# Patient Record
Sex: Male | Born: 1965
Health system: Southern US, Community
[De-identification: ages and names within clinical notes are randomized; demographics above are authoritative.]

## PROBLEM LIST (undated history)

## (undated) DIAGNOSIS — T7840XA Allergy, unspecified, initial encounter: Secondary | ICD-10-CM

## (undated) HISTORY — PX: HERNIA REPAIR: SHX51

## (undated) HISTORY — PX: POLYPECTOMY: SHX149

## (undated) HISTORY — PX: COLONOSCOPY: SHX174

## (undated) HISTORY — PX: VASECTOMY: SHX75

## (undated) HISTORY — DX: Allergy, unspecified, initial encounter: T78.40XA

---

## 2010-01-14 ENCOUNTER — Ambulatory Visit (HOSPITAL_COMMUNITY): Admission: RE | Admit: 2010-01-14 | Discharge: 2010-01-14 | Payer: Self-pay | Admitting: Surgery

## 2014-12-23 ENCOUNTER — Encounter (HOSPITAL_COMMUNITY): Payer: Self-pay

## 2014-12-23 ENCOUNTER — Emergency Department (INDEPENDENT_AMBULATORY_CARE_PROVIDER_SITE_OTHER)
Admission: EM | Admit: 2014-12-23 | Discharge: 2014-12-23 | Disposition: A | Discharge status: Home / Self Care | Payer: Managed Care, Other (non HMO) | Source: Home / Self Care | Attending: Family Medicine | Admitting: Family Medicine

## 2014-12-23 DIAGNOSIS — J302 Other seasonal allergic rhinitis: Secondary | ICD-10-CM | POA: Diagnosis not present

## 2014-12-23 MED ORDER — FLUTICASONE PROPIONATE 50 MCG/ACT NA SUSP: 1.0000 | Freq: Two times a day (BID) | NASAL | Status: DC

## 2014-12-23 MED ORDER — TRIAMCINOLONE ACETONIDE 40 MG/ML IJ SUSP
INTRAMUSCULAR | Status: AC
  Filled 2014-12-23: qty 1

## 2014-12-23 MED ORDER — TRIAMCINOLONE ACETONIDE 40 MG/ML IJ SUSP
40.0000 mg | Freq: Once | INTRAMUSCULAR | Status: AC
  Administered 2014-12-23: 40 mg via INTRAMUSCULAR

## 2014-12-23 NOTE — ED Provider Notes (Signed)
CSN: [id number]     Arrival date & time 12/23/14  6314 History   First MD Initiated Contact with Patient 12/23/14 [phone]     Chief Complaint  Patient presents with  . Cough   (Consider location/radiation/quality/duration/timing/severity/associated sxs/prior Treatment) Patient is a 49 y.o. male presenting with cough. The history is provided by the patient.  Cough Cough characteristics:  Non-productive, dry and harsh Severity:  Moderate Onset quality:  Gradual Duration:  4 days Progression:  Unchanged Chronicity:  New Smoker: no   Context: weather changes   Ineffective treatments:  Cough suppressants Associated symptoms: eye discharge, rhinorrhea and sinus congestion   Associated symptoms: no chills, no fever, no rash and no wheezing     History reviewed. No pertinent past medical history. History reviewed. No pertinent past surgical history. No family history on file. History  Substance Use Topics  . Smoking status: Not on file  . Smokeless tobacco: Not on file  . Alcohol Use: Not on file    Review of Systems  Constitutional: Negative for fever and chills.  HENT: Positive for congestion, postnasal drip and rhinorrhea.   Eyes: Positive for discharge.  Respiratory: Positive for cough. Negative for wheezing.   Cardiovascular: Negative.   Gastrointestinal: Negative.   Skin: Negative for rash.    Allergies  Review of patient's allergies indicates no known allergies.  Home Medications   Prior to Admission medications   Medication Sig Start Date End Date Taking? Authorizing Provider  fluticasone (FLONASE) 50 MCG/ACT nasal spray Place 1 spray into both nostrils 2 (two) times daily. 12/23/14   Billy Fischer, MD   BP 135/68 mmHg  Pulse 73  Temp(Src) 98.3 F (36.8 C) (Oral)  Resp 18  SpO2 98% Physical Exam  Constitutional: He appears well-developed and well-nourished.  HENT:  Right Ear: External ear normal.  Left Ear: External ear normal.  Mouth/Throat: Oropharynx is  clear and moist.  Eyes: Conjunctivae are normal. Pupils are equal, round, and reactive to light.  Neck: Normal range of motion. Neck supple.  Cardiovascular: Normal heart sounds and intact distal pulses.   Pulmonary/Chest: Effort normal and breath sounds normal.  Lymphadenopathy:    He has no cervical adenopathy.  Skin: Skin is warm and dry.  Nursing note and vitals reviewed.   ED Course  Procedures (including critical care time) Labs Review Labs Reviewed - No data to display  Imaging Review No results found.   MDM   1. Seasonal allergic rhinitis        Billy Fischer, MD 12/23/14 [phone]

## 2014-12-23 NOTE — ED Notes (Signed)
Patient complains of cough, congestion "goop" in his eyes Symptoms started about four days ago OTC medications not working Does take medication for allergies but not clearing him up

## 2015-06-13 ENCOUNTER — Emergency Department (HOSPITAL_COMMUNITY): Payer: Managed Care, Other (non HMO)

## 2015-06-13 ENCOUNTER — Emergency Department (HOSPITAL_COMMUNITY)
Admission: EM | Admit: 2015-06-13 | Discharge: 2015-06-13 | Disposition: A | Discharge status: Home / Self Care | Payer: Managed Care, Other (non HMO) | Attending: Emergency Medicine | Admitting: Emergency Medicine

## 2015-06-13 ENCOUNTER — Encounter (HOSPITAL_COMMUNITY): Payer: Self-pay | Admitting: Emergency Medicine

## 2015-06-13 DIAGNOSIS — Y929 Unspecified place or not applicable: Secondary | ICD-10-CM | POA: Diagnosis not present

## 2015-06-13 DIAGNOSIS — S99922A Unspecified injury of left foot, initial encounter: Secondary | ICD-10-CM | POA: Diagnosis present

## 2015-06-13 DIAGNOSIS — S92502A Displaced unspecified fracture of left lesser toe(s), initial encounter for closed fracture: Secondary | ICD-10-CM

## 2015-06-13 DIAGNOSIS — Y999 Unspecified external cause status: Secondary | ICD-10-CM | POA: Insufficient documentation

## 2015-06-13 DIAGNOSIS — S92512A Displaced fracture of proximal phalanx of left lesser toe(s), initial encounter for closed fracture: Secondary | ICD-10-CM | POA: Diagnosis not present

## 2015-06-13 DIAGNOSIS — Z7951 Long term (current) use of inhaled steroids: Secondary | ICD-10-CM | POA: Diagnosis not present

## 2015-06-13 DIAGNOSIS — Y9389 Activity, other specified: Secondary | ICD-10-CM | POA: Insufficient documentation

## 2015-06-13 DIAGNOSIS — W2209XA Striking against other stationary object, initial encounter: Secondary | ICD-10-CM | POA: Insufficient documentation

## 2015-06-13 MED ORDER — TRAMADOL HCL 50 MG PO TABS: 50.0000 mg | ORAL_TABLET | Freq: Four times a day (QID) | ORAL | Status: DC | PRN

## 2015-06-13 NOTE — ED Notes (Signed)
The patient was able to walk in to the emergency department but does say it hurts to put weight on the foot.

## 2015-06-13 NOTE — Discharge Instructions (Signed)
As discussed, it is important that you follow-up with our orthopedist in one week.  Return here for concerning changes in your condition.

## 2015-06-13 NOTE — ED Notes (Signed)
The patient said he ran into some furniture and hit his left foot.  He does have swelling, and redness to the left foot.  He rates his pain 8/10.

## 2015-06-13 NOTE — ED Provider Notes (Signed)
CSN: [id number]     Arrival date & time 06/13/15  0458 History   First MD Initiated Contact with Patient 06/13/15 0459     Chief Complaint  Patient presents with  . Foot Injury    The patient said he ran into some furniture and hit his left foot.  He does have swelling, and redness to the left foot.  He rates his pain 8/10.     (Consider location/radiation/quality/duration/timing/severity/associated sxs/prior Treatment) HPI Patient presents with pain in the left lateral distal foot after minor trauma. Patient ran his foot into a piece of furniture accident. Since that time he said throbbing soreness in the left foot. No loss of sensation or function. No other injuries sustained. Minimal relief with rest. It is worse with weightbearing.  History reviewed. No pertinent past medical history. Past Surgical History  Procedure Laterality Date  . Hernia repair     History reviewed. No pertinent family history. Social History  Substance Use Topics  . Smoking status: Never Smoker   . Smokeless tobacco: Never Used  . Alcohol Use: Yes     Comment: socially    Review of Systems  Constitutional: Negative for fever.  Respiratory: Negative for shortness of breath.   Cardiovascular: Negative for chest pain.  Musculoskeletal:       Negative aside from HPI  Skin:       Negative aside from HPI  Allergic/Immunologic: Negative for immunocompromised state.  Neurological: Negative for weakness.      Allergies  Review of patient's allergies indicates no known allergies.  Home Medications   Prior to Admission medications   Medication Sig Start Date End Date Taking? Authorizing Provider  fluticasone (FLONASE) 50 MCG/ACT nasal spray Place 1 spray into both nostrils 2 (two) times daily. 12/23/14   Billy Fischer, MD   There were no vitals taken for this visit. Physical Exam  Constitutional: He is oriented to person, place, and time. He appears well-developed. No distress.  HENT:  Head:  Normocephalic and atraumatic.  Eyes: Conjunctivae and EOM are normal.  Cardiovascular: Normal rate, regular rhythm and intact distal pulses.   Pulmonary/Chest: Effort normal. No stridor. No respiratory distress.  Musculoskeletal: He exhibits no edema.       Left ankle: Normal.       Feet:  Neurological: He is alert and oriented to person, place, and time.  Skin: Skin is warm and dry.  Psychiatric: He has a normal mood and affect.  Nursing note and vitals reviewed.   ED Course  Procedures (including critical care time)  After the initial evaluation the patient had ice pack applied to his injury.  I reviewed the results (including imaging as performed), agree with the interpretation  On repeat exam the patient appears better.  We reviewed all findings, and I showed him his XRAY.   Patient placed in post-op shoe   MDM   Patient presents after sustaining trauma to his left distal foot. X-rays demonstrate fracture of the fifth toe, no other notable injuries. Patient tolerated immobilization with postoperative shoe well, and after conversation about home care, he was discharged in stable condition to follow-up with orthopedics.  Carmin Muskrat, MD 06/13/15 [phone]

## 2015-06-13 NOTE — ED Notes (Signed)
Patient is alert and orientedx4.  Patient was explained discharge instructions and they understood them with no questions.   

## 2015-11-04 ENCOUNTER — Encounter: Payer: Self-pay | Admitting: Gastroenterology

## 2015-12-30 ENCOUNTER — Ambulatory Visit (AMBULATORY_SURGERY_CENTER): Payer: Self-pay | Admitting: *Deleted

## 2015-12-30 VITALS — Ht 69.0 in | Wt 254.0 lb

## 2015-12-30 DIAGNOSIS — Z1211 Encounter for screening for malignant neoplasm of colon: Secondary | ICD-10-CM

## 2015-12-30 MED ORDER — NA SULFATE-K SULFATE-MG SULF 17.5-3.13-1.6 GM/177ML PO SOLN: 1.0000 | Freq: Once | ORAL | Status: DC

## 2015-12-30 NOTE — Progress Notes (Signed)
No egg or soy allergy known to patient  No issues with past sedation with any surgeries  or procedures, no intubation problems  No diet pills per patient No home 02 use per patient  No blood thinners per patient  Pt denies issues with constipation  emmi declined

## 2016-01-17 ENCOUNTER — Encounter: Payer: Managed Care, Other (non HMO) | Admitting: Gastroenterology

## 2016-03-17 ENCOUNTER — Telehealth: Payer: Self-pay | Admitting: Gastroenterology

## 2016-03-17 DIAGNOSIS — Z1211 Encounter for screening for malignant neoplasm of colon: Secondary | ICD-10-CM

## 2016-03-17 MED ORDER — NA SULFATE-K SULFATE-MG SULF 17.5-3.13-1.6 GM/177ML PO SOLN: 1.0000 | Freq: Once | ORAL | Status: DC

## 2016-03-17 MED FILL — SUPREP BOWEL PREP KIT: 17.5-3.13-1 | 1 days supply | Qty: 354 | Fill #0

## 2016-03-17 NOTE — Telephone Encounter (Signed)
Resent prep to [facility]- attempted to pt notifiy pt called number left with no answer- marie PV

## 2016-03-20 ENCOUNTER — Ambulatory Visit (AMBULATORY_SURGERY_CENTER): Payer: Managed Care, Other (non HMO) | Admitting: Gastroenterology

## 2016-03-20 ENCOUNTER — Encounter: Payer: Self-pay | Admitting: Gastroenterology

## 2016-03-20 VITALS — BP 127/86 | HR 65 | Temp 98.6°F | Resp 23 | Ht 69.0 in | Wt 254.0 lb

## 2016-03-20 DIAGNOSIS — Z1211 Encounter for screening for malignant neoplasm of colon: Secondary | ICD-10-CM

## 2016-03-20 DIAGNOSIS — D123 Benign neoplasm of transverse colon: Secondary | ICD-10-CM

## 2016-03-20 DIAGNOSIS — D122 Benign neoplasm of ascending colon: Secondary | ICD-10-CM | POA: Diagnosis not present

## 2016-03-20 DIAGNOSIS — K635 Polyp of colon: Secondary | ICD-10-CM | POA: Diagnosis not present

## 2016-03-20 MED ORDER — SODIUM CHLORIDE 0.9 % IV SOLN: 500.0000 mL | INTRAVENOUS | Status: DC

## 2016-03-20 NOTE — Patient Instructions (Signed)
YOU HAD AN ENDOSCOPIC PROCEDURE TODAY AT [facility]:   Refer to the procedure report that was given to you for any specific questions about what was found during the examination.  If the procedure report does not answer your questions, please call your gastroenterologist to clarify.  If you requested that your care partner not be given the details of your procedure findings, then the procedure report has been included in a sealed envelope for you to review at your convenience later.  YOU SHOULD EXPECT: Some feelings of bloating in the abdomen. Passage of more gas than usual.  Walking can help get rid of the air that was put into your GI tract during the procedure and reduce the bloating. If you had a lower endoscopy (such as a colonoscopy or flexible sigmoidoscopy) you may notice spotting of blood in your stool or on the toilet paper. If you underwent a bowel prep for your procedure, you may not have a normal bowel movement for a few days.  Please Note:  You might notice some irritation and congestion in your nose or some drainage.  This is from the oxygen used during your procedure.  There is no need for concern and it should clear up in a day or so.  SYMPTOMS TO REPORT IMMEDIATELY:   Following lower endoscopy (colonoscopy or flexible sigmoidoscopy):  Excessive amounts of blood in the stool  Significant tenderness or worsening of abdominal pains  Swelling of the abdomen that is new, acute  Fever of 100F or higher    For urgent or emergent issues, a gastroenterologist can be reached at any hour by calling [phone].   DIET: Your first meal following the procedure should be a small meal and then it is ok to progress to your normal diet. Heavy or fried foods are harder to digest and may make you feel nauseous or bloated.  Likewise, meals heavy in dairy and vegetables can increase bloating.  Drink plenty of fluids but you should avoid alcoholic beverages for 24  hours.  ACTIVITY:  You should plan to take it easy for the rest of today and you should NOT DRIVE or use heavy machinery until tomorrow (because of the sedation medicines used during the test).    FOLLOW UP: Our staff will call the number listed on your records the next business day following your procedure to check on you and address any questions or concerns that you may have regarding the information given to you following your procedure. If we do not reach you, we will leave a message.  However, if you are feeling well and you are not experiencing any problems, there is no need to return our call.  We will assume that you have returned to your regular daily activities without incident.  If any biopsies were taken you will be contacted by phone or by letter within the next 1-3 weeks.  Please call us at [phone] if you have not heard about the biopsies in 3 weeks.    SIGNATURES/CONFIDENTIALITY: You and/or your care partner have signed paperwork which will be entered into your electronic medical record.  These signatures attest to the fact that that the information above on your After Visit Summary has been reviewed and is understood.  Full responsibility of the confidentiality of this discharge information lies with you and/or your care-partner.   No Aspirin or anti inflammatory products for 2 weeks.  Information on polyps given to you today  Information on hemorrhoid banding  given to you today

## 2016-03-20 NOTE — Op Note (Signed)
[name] Procedure Date: 03/20/2016 1:09 PM MRN: [id number] Endoscopist: Remo Lipps P. Havery Moros , MD Age: 50 Referring MD:  Date of Birth: 09/10/1966 Gender: Male Account #: [id number] Procedure:                Colonoscopy Indications:              Screening for malignant neoplasm in the colon, This                            is the patient's first colonoscopy Medicines:                Monitored Anesthesia Care Procedure:                Pre-Anesthesia Assessment:                           - Prior to the procedure, a History and Physical                            was performed, and patient medications and                            allergies were reviewed. The patient's tolerance of                            previous anesthesia was also reviewed. The risks                            and benefits of the procedure and the sedation                            options and risks were discussed with the patient.                            All questions were answered, and informed consent                            was obtained. Prior Anticoagulants: The patient has                            taken no previous anticoagulant or antiplatelet                            agents. ASA Grade Assessment: II - A patient with                            mild systemic disease. After reviewing the risks                            and benefits, the patient was deemed in                            satisfactory condition to undergo the procedure.  After obtaining informed consent, the colonoscope                            was passed under direct vision. Throughout the                            procedure, the patient's blood pressure, pulse, and                            oxygen saturations were monitored continuously. The                            Model CF-HQ190L [id number]) scope was introduced                            through the  anus and advanced to the the terminal                            ileum, with identification of the appendiceal                            orifice and IC valve. The colonoscopy was performed                            without difficulty. The patient tolerated the                            procedure well. The quality of the bowel                            preparation was good. The terminal ileum, ileocecal                            valve, appendiceal orifice, and rectum were                            photographed. Scope In: 1:32:53 PM Scope Out: 1:51:33 PM Scope Withdrawal Time: 0 hours 16 minutes 46 seconds  Total Procedure Duration: 0 hours 18 minutes 40 seconds  Findings:                 The perianal and digital rectal examinations were                            normal.                           A 15 mm polyp was found in the ascending colon. The                            polyp was semi-pedunculated. The polyp was removed                            with a hot snare. Resection and retrieval were  complete.                           Two sessile polyps were found in the transverse                            colon. The polyps were 4 mm in size. These polyps                            were removed with a cold snare. Resection and                            retrieval were complete.                           A 4 mm polyp was found in the splenic flexure. The                            polyp was sessile. The polyp was removed with a                            cold snare. Resection and retrieval were complete.                           Non-bleeding internal hemorrhoids were found during                            retroflexion.                           The terminal ileum appeared normal.                           The exam was otherwise normal throughout the                            examined colon. Complications:            No immediate complications. Estimated  blood loss:                            Minimal. Estimated Blood Loss:     Estimated blood loss was minimal. Impression:               - One 15 mm polyp in the ascending colon, removed                            with a hot snare. Resected and retrieved.                           - Two 4 mm polyps in the transverse colon, removed                            with a cold snare. Resected and retrieved.                           -  One 4 mm polyp at the splenic flexure, removed                            with a cold snare. Resected and retrieved.                           - Non-bleeding internal hemorrhoids.                           - The examined portion of the ileum was normal. Recommendation:           - Patient has a contact number available for                            emergencies. The signs and symptoms of potential                            delayed complications were discussed with the                            patient. Return to normal activities tomorrow.                            Written discharge instructions were provided to the                            patient.                           - Resume previous diet.                           - Continue present medications.                           - No aspirin, ibuprofen, naproxen, or other                            non-steroidal anti-inflammatory drugs for 2 weeks                            after polyp removal.                           - Await pathology results.                           - Repeat colonoscopy is recommended for                            surveillance. The colonoscopy date will be                            determined after pathology results from today's  exam become available for review. Remo Lipps P. Krishay Faro, MD 03/20/2016 1:56:55 PM This report has been signed electronically.

## 2016-03-20 NOTE — Progress Notes (Signed)
A and O x3. Report to RN. Tolerated MAC anesthesia well. 

## 2016-03-20 NOTE — Progress Notes (Signed)
Called to room to assist during endoscopic procedure.  Patient ID and intended procedure confirmed with present staff. Received instructions for my participation in the procedure from the performing physician.  

## 2016-03-23 ENCOUNTER — Telehealth: Payer: Self-pay | Admitting: *Deleted

## 2016-03-23 NOTE — Telephone Encounter (Signed)
  Follow up Call-  Call back number 03/20/2016  Post procedure Call Back phone  # [phone]  Permission to leave phone message Yes     Patient questions:  Do you have a fever, pain , or abdominal swelling? No. Pain Score  0 *  Have you tolerated food without any problems? Yes.    Have you been able to return to your normal activities? Yes.    Do you have any questions about your discharge instructions: Diet   No. Medications  No. Follow up visit  No.  Do you have questions or concerns about your Care? No.  Actions: * If pain score is 4 or above: No action needed, pain <4.

## 2016-03-26 ENCOUNTER — Encounter: Payer: Self-pay | Admitting: Gastroenterology

## 2017-02-09 MED FILL — CHLORHEXIDINE 0.12% RINSE: 0.12 | 16 days supply | Qty: 473 | Fill #0

## 2017-10-14 MED FILL — PENICILLIN VK 500 MG TABLET: 500 | 12 days supply | Qty: 40 | Fill #0

## 2017-10-27 MED FILL — OSELTAMIVIR PHOSPHATE 75 MG: 75 | 10 days supply | Qty: 10 | Fill #0

## 2017-11-08 ENCOUNTER — Other Ambulatory Visit: Payer: Self-pay | Admitting: Internal Medicine

## 2017-11-08 DIAGNOSIS — N5089 Other specified disorders of the male genital organs: Secondary | ICD-10-CM

## 2017-11-09 ENCOUNTER — Other Ambulatory Visit: Payer: Managed Care, Other (non HMO)

## 2017-11-15 ENCOUNTER — Ambulatory Visit
Admission: RE | Admit: 2017-11-15 | Discharge: 2017-11-15 | Disposition: A | Discharge status: Home / Self Care | Payer: 59 | Source: Ambulatory Visit | Attending: Internal Medicine | Admitting: Internal Medicine

## 2017-11-15 DIAGNOSIS — N5089 Other specified disorders of the male genital organs: Secondary | ICD-10-CM

## 2017-11-16 MED FILL — CEFDINIR 300 MG CAPS: 300 | 7 days supply | Qty: 14 | Fill #0

## 2017-11-16 MED FILL — HYDROCODONE-HOMATROPINE SOL: 5-1.5 | 9 days supply | Qty: 180 | Fill #0

## 2017-12-24 MED FILL — CHLORHEXIDINE 0.12% RINSE: 0.12 | 16 days supply | Qty: 473 | Fill #1

## 2018-11-08 MED FILL — PENICILLIN VK 500 MG TABLET: 500 | 10 days supply | Qty: 20 | Fill #0

## 2018-11-08 MED FILL — BENZONATATE 100 MG CAP: 100 | 6 days supply | Qty: 20 | Fill #0

## 2019-02-21 MED FILL — CHLORHEXIDINE 0.12% RINSE: 0.12 | 16 days supply | Qty: 473 | Fill #0

## 2019-03-09 ENCOUNTER — Encounter: Payer: Self-pay | Admitting: Gastroenterology

## 2019-04-04 MED FILL — CHLORHEXIDINE 0.12% RINSE: 0.12 | 16 days supply | Qty: 473 | Fill #0

## 2019-04-25 ENCOUNTER — Encounter: Payer: Self-pay | Admitting: Gastroenterology

## 2019-06-05 ENCOUNTER — Ambulatory Visit (AMBULATORY_SURGERY_CENTER): Payer: Self-pay | Admitting: *Deleted

## 2019-06-05 ENCOUNTER — Other Ambulatory Visit: Payer: Self-pay

## 2019-06-05 VITALS — Temp 97.5°F | Ht 69.0 in | Wt 263.0 lb

## 2019-06-05 DIAGNOSIS — Z86010 Personal history of colonic polyps: Secondary | ICD-10-CM

## 2019-06-05 MED ORDER — NA SULFATE-K SULFATE-MG SULF 17.5-3.13-1.6 GM/177ML PO SOLN: ORAL | 0 refills | Status: DC

## 2019-06-05 MED FILL — SUPREP BOWEL PREP KIT: 17.5-3.13-1 | 1 days supply | Qty: 354 | Fill #0

## 2019-06-05 NOTE — Progress Notes (Signed)
Patient is here in-person for PV. Patient denies any allergies to eggs or soy. Patient denies any problems with anesthesia/sedation. Patient denies any oxygen use at home. Patient denies taking any diet/weight loss medications or blood thinners. Patient is not being treated for MRSA or C-diff.  Suprep $15 off coupon given to pt.   Pt is aware that care partner will wait in the car during procedure; if they feel like they will be too hot to wait in the car; they may wait in the lobby.  We want them to wear a mask (we do not have any that we can provide them), practice social distancing, and we will check their temperatures when they get here.  I did remind patient that their care partner needs to stay in the parking lot the entire time. Pt will wear mask into building.

## 2019-06-07 ENCOUNTER — Encounter: Payer: Self-pay | Admitting: Gastroenterology

## 2019-06-19 ENCOUNTER — Telehealth: Payer: Self-pay

## 2019-06-19 NOTE — Telephone Encounter (Signed)
Covid-19 screening questions   Do you now or have you had a fever in the last 14 days?  Do you have any respiratory symptoms of shortness of breath or cough now or in the last 14 days?  Do you have any family members or close contacts with diagnosed or suspected Covid-19 in the past 14 days?  Have you been tested for Covid-19 and found to be positive?       

## 2019-06-20 ENCOUNTER — Encounter: Payer: Self-pay | Admitting: Gastroenterology

## 2019-06-20 ENCOUNTER — Other Ambulatory Visit: Payer: Self-pay

## 2019-06-20 ENCOUNTER — Ambulatory Visit (AMBULATORY_SURGERY_CENTER): Payer: 59 | Admitting: Gastroenterology

## 2019-06-20 VITALS — BP 120/69 | HR 62 | Temp 98.4°F | Resp 14 | Ht 69.0 in | Wt 263.0 lb

## 2019-06-20 DIAGNOSIS — K635 Polyp of colon: Secondary | ICD-10-CM

## 2019-06-20 DIAGNOSIS — D123 Benign neoplasm of transverse colon: Secondary | ICD-10-CM | POA: Diagnosis not present

## 2019-06-20 DIAGNOSIS — D120 Benign neoplasm of cecum: Secondary | ICD-10-CM

## 2019-06-20 DIAGNOSIS — Z1211 Encounter for screening for malignant neoplasm of colon: Secondary | ICD-10-CM

## 2019-06-20 DIAGNOSIS — Z86010 Personal history of colonic polyps: Secondary | ICD-10-CM

## 2019-06-20 MED ORDER — SODIUM CHLORIDE 0.9 % IV SOLN: 500.0000 mL | Freq: Once | INTRAVENOUS | Status: DC

## 2019-06-20 NOTE — Progress Notes (Signed)
Pt stated no changed in medical or surgical history

## 2019-06-20 NOTE — Op Note (Signed)
[facility] Patient Name: Michael Rios Procedure Date: 06/20/2019 1:28 PM MRN: [id number] Endoscopist: Remo Lipps P. Havery Moros , MD Age: 53 Referring MD:  Date of Birth: [birth year] Gender: Male Account #: [id number] Procedure:                Colonoscopy Indications:              Surveillance: Personal history of adenomatous                            polyps on last colonoscopy 3 years ago (advanced                            adenoma x 1 and other smaller polyps) Medicines:                Monitored Anesthesia Care Procedure:                Pre-Anesthesia Assessment:                           - Prior to the procedure, a History and Physical                            was performed, and patient medications and                            allergies were reviewed. The patient's tolerance of                            previous anesthesia was also reviewed. The risks                            and benefits of the procedure and the sedation                            options and risks were discussed with the patient.                            All questions were answered, and informed consent                            was obtained. Prior Anticoagulants: The patient has                            taken no previous anticoagulant or antiplatelet                            agents. ASA Grade Assessment: III - A patient with                            severe systemic disease. After reviewing the risks                            and benefits, the patient was deemed in  satisfactory condition to undergo the procedure.                           After obtaining informed consent, the colonoscope                            was passed under direct vision. Throughout the                            procedure, the patient's blood pressure, pulse, and                            oxygen saturations were monitored continuously. The                            Colonoscope was  introduced through the anus and                            advanced to the the cecum, identified by                            appendiceal orifice and ileocecal valve. The                            colonoscopy was performed without difficulty. The                            patient tolerated the procedure well. The quality                            of the bowel preparation was good. The ileocecal                            valve, appendiceal orifice, and rectum were                            photographed. Scope In: 1:32:16 PM Scope Out: 1:57:13 PM Scope Withdrawal Time: 0 hours 22 minutes 12 seconds  Total Procedure Duration: 0 hours 24 minutes 57 seconds  Findings:                 The perianal and digital rectal examinations were                            normal.                           A 4 mm polyp was found in the cecum. The polyp was                            flat. The polyp was removed with a cold snare.                            Resection and retrieval were complete.  A 3 mm polyp was found in the hepatic flexure. The                            polyp was sessile. The polyp was removed with a                            cold snare. Resection and retrieval were complete.                           A diminutive polyp was found in the transverse                            colon. The polyp was flat. The polyp was removed                            with a cold biopsy forceps, as the snare was not                            grasping the lesion well. Resection and retrieval                            were complete.                           The colon was tortuous.                           Internal hemorrhoids were found during retroflexion.                           The exam was otherwise without abnormality. Complications:            No immediate complications. Estimated blood loss:                            Minimal. Estimated Blood Loss:     Estimated  blood loss was minimal. Impression:               - One 4 mm polyp in the cecum, removed with a cold                            snare. Resected and retrieved.                           - One 3 mm polyp at the hepatic flexure, removed                            with a cold snare. Resected and retrieved.                           - One diminutive polyp in the transverse colon,                            removed with a cold biopsy forceps. Resected  and                            retrieved.                           - Tortuous colon.                           - Internal hemorrhoids.                           - The examination was otherwise normal. Recommendation:           - Patient has a contact number available for                            emergencies. The signs and symptoms of potential                            delayed complications were discussed with the                            patient. Return to normal activities tomorrow.                            Written discharge instructions were provided to the                            patient.                           - Resume previous diet.                           - Continue present medications.                           - Await pathology results. Remo Lipps P. Zlata Alcaide, MD 06/20/2019 2:01:13 PM This report has been signed electronically.

## 2019-06-20 NOTE — Progress Notes (Signed)
No problems noted in the recovery room. maw 

## 2019-06-20 NOTE — Progress Notes (Signed)
Called to room to assist during endoscopic procedure.  Patient ID and intended procedure confirmed with present staff. Received instructions for my participation in the procedure from the performing physician.  

## 2019-06-20 NOTE — Progress Notes (Signed)
A/ox3, pleased with MAC, report to RN 

## 2019-06-20 NOTE — Patient Instructions (Addendum)
YOU HAD AN ENDOSCOPIC PROCEDURE TODAY AT [facility]:   Refer to the procedure report that was given to you for any specific questions about what was found during the examination.  If the procedure report does not answer your questions, please call your gastroenterologist to clarify.  If you requested that your care partner not be given the details of your procedure findings, then the procedure report has been included in a sealed envelope for you to review at your convenience later.  YOU SHOULD EXPECT: Some feelings of bloating in the abdomen. Passage of more gas than usual.  Walking can help get rid of the air that was put into your GI tract during the procedure and reduce the bloating. If you had a lower endoscopy (such as a colonoscopy or flexible sigmoidoscopy) you may notice spotting of blood in your stool or on the toilet paper. If you underwent a bowel prep for your procedure, you may not have a normal bowel movement for a few days.  Please Note:  You might notice some irritation and congestion in your nose or some drainage.  This is from the oxygen used during your procedure.  There is no need for concern and it should clear up in a day or so.  SYMPTOMS TO REPORT IMMEDIATELY:   Following lower endoscopy (colonoscopy or flexible sigmoidoscopy):  Excessive amounts of blood in the stool  Significant tenderness or worsening of abdominal pains  Swelling of the abdomen that is new, acute  Fever of 100F or higher   For urgent or emergent issues, a gastroenterologist can be reached at any hour by calling [phone].   DIET:  We do recommend a small meal at first, but then you may proceed to your regular diet.  Drink plenty of fluids but you should avoid alcoholic beverages for 24 hours.  ACTIVITY:  You should plan to take it easy for the rest of today and you should NOT DRIVE or use heavy machinery until tomorrow (because of the sedation medicines used during the test).     FOLLOW UP: Our staff will call the number listed on your records 48-72 hours following your procedure to check on you and address any questions or concerns that you may have regarding the information given to you following your procedure. If we do not reach you, we will leave a message.  We will attempt to reach you two times.  During this call, we will ask if you have developed any symptoms of COVID 19. If you develop any symptoms (ie: fever, flu-like symptoms, shortness of breath, cough etc.) before then, please call [phone].  If you test positive for Covid 19 in the 2 weeks post procedure, please call and report this information to [place].    If any biopsies were taken you will be contacted by phone or by letter within the next 1-3 weeks.  Please call us at [phone] if you have not heard about the biopsies in 3 weeks.    SIGNATURES/CONFIDENTIALITY: You and/or your care partner have signed paperwork which will be entered into your electronic medical record.  These signatures attest to the fact that that the information above on your After Visit Summary has been reviewed and is understood.  Full responsibility of the confidentiality of this discharge information lies with you and/or your care-partner.   Handouts were given to you on polyps and hemorrhoids. You may resume your current medications today. Await biopsy results. Please call if any questions or  concerns.

## 2019-06-22 ENCOUNTER — Telehealth: Payer: Self-pay

## 2019-06-22 NOTE — Telephone Encounter (Signed)
No answer, left message to call back later today, B.Avanti Jetter RN. 

## 2019-06-22 NOTE — Telephone Encounter (Signed)
Attempted to reach patient for post-procedure f/u call. No answer. Left message for him to please not hesitate to call us if he has any questions/concerns regarding his care. 

## 2019-06-23 ENCOUNTER — Encounter: Payer: Self-pay | Admitting: Gastroenterology

## 2019-08-09 IMAGING — US US SCROTUM W/ DOPPLER COMPLETE
1 series · 13 of 25 positions shown · non-contrast
Comparison: None in PACs

CLINICAL DATA: Left scrotal mass

EXAM:
SCROTAL ULTRASOUND
DOPPLER ULTRASOUND OF THE TESTICLES
TECHNIQUE: Complete ultrasound examination of the testicles, epididymis, and
other scrotal structures was performed. Color and spectral Doppler
ultrasound were also utilized to evaluate blood flow to the
testicles.

[Series 1: us scrotum w/ doppler complete · 0.06mm/px · 13 of 65 slices shown]
[im 1/65]
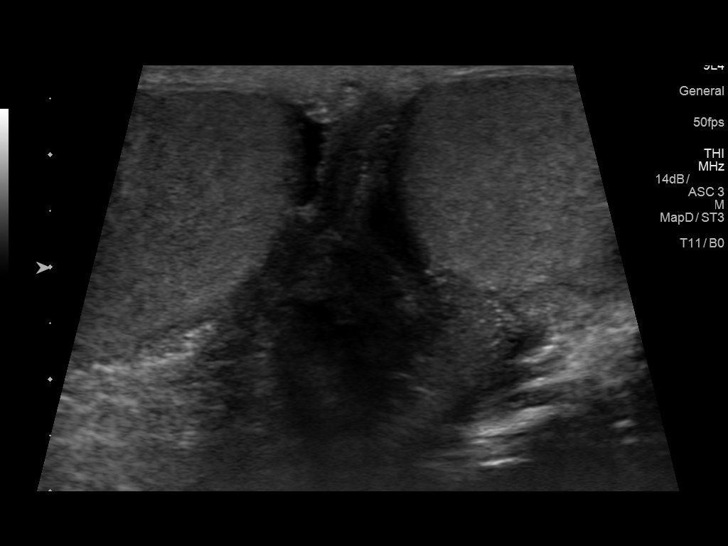
[im 6/65]
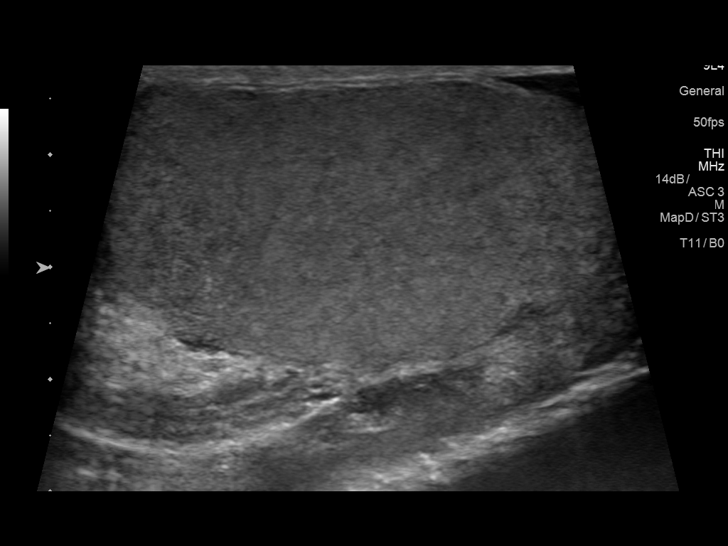
[im 11/65]
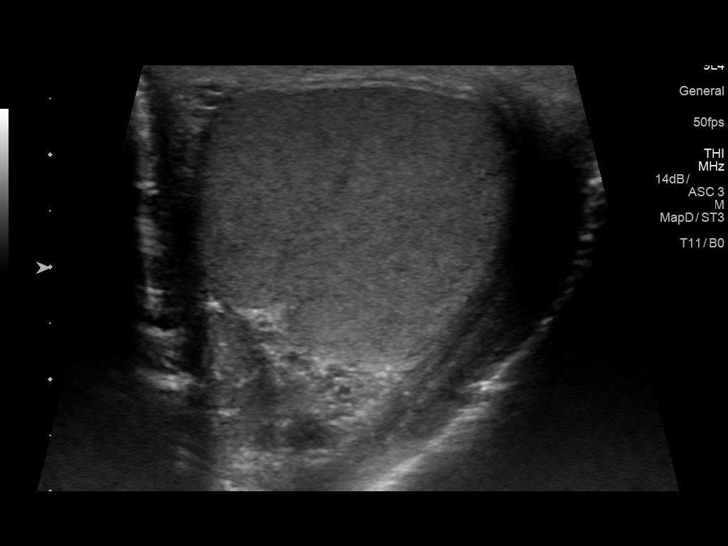
[im 17/65]
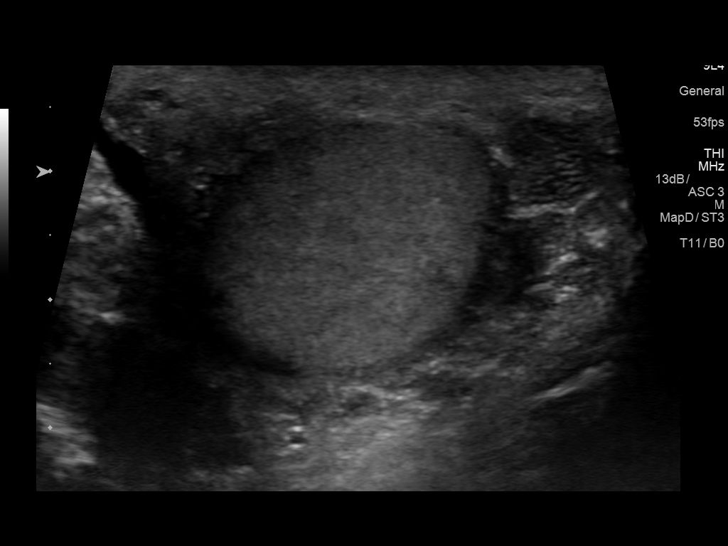
[im 22/65]
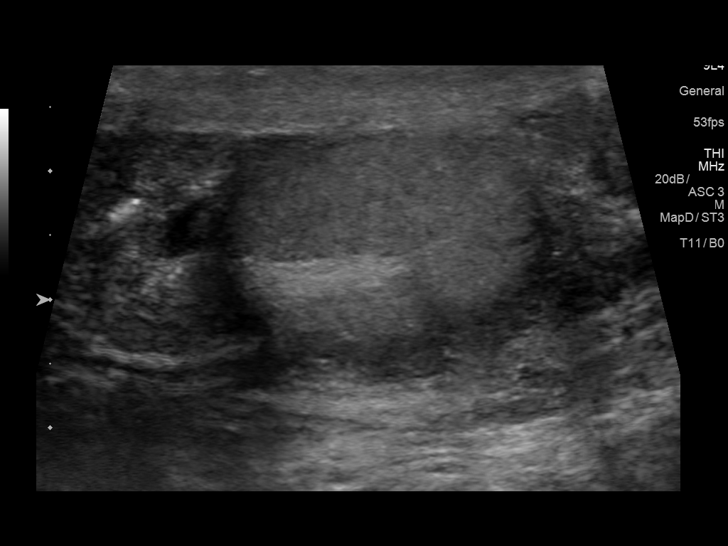
[im 27/65]
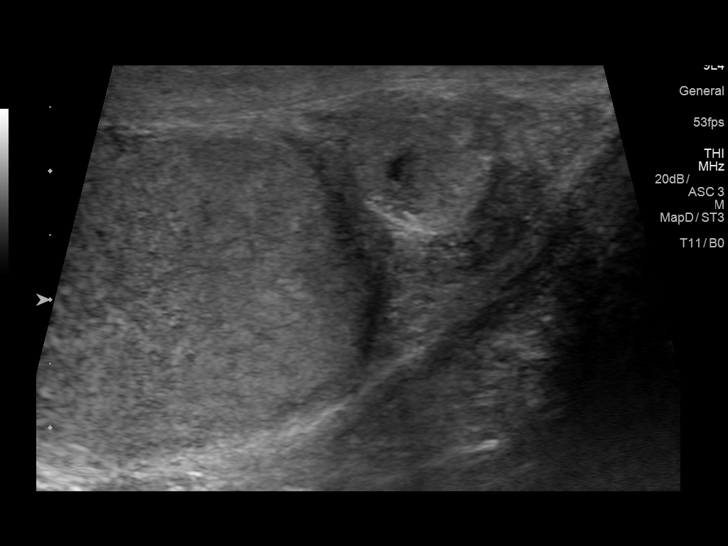
[im 33/65]
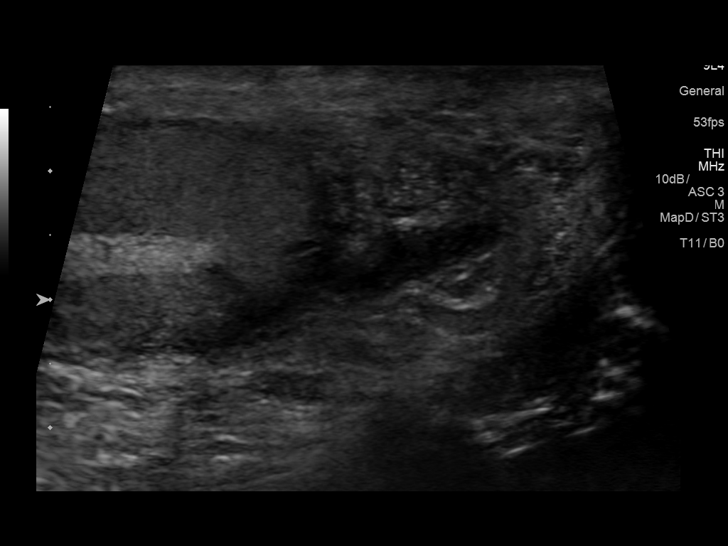
[im 38/65]
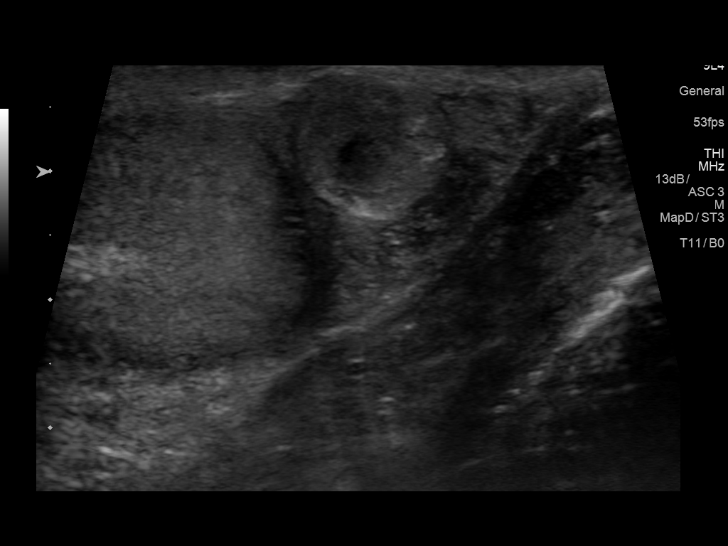
[im 43/65]
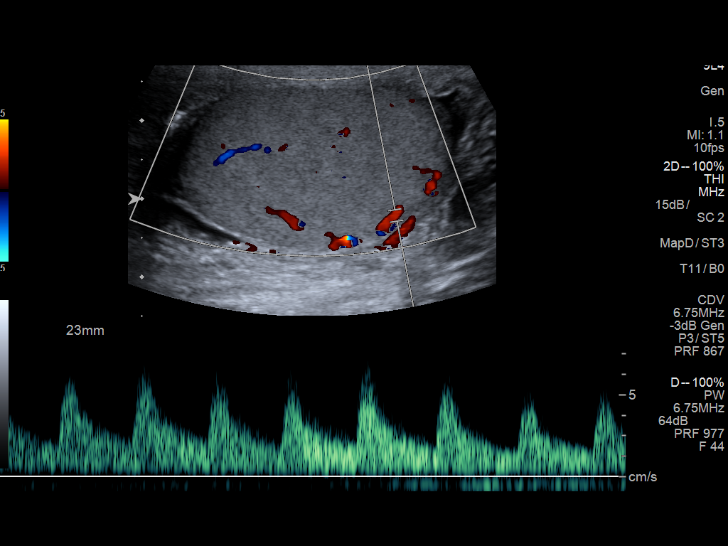
[im 49/65]
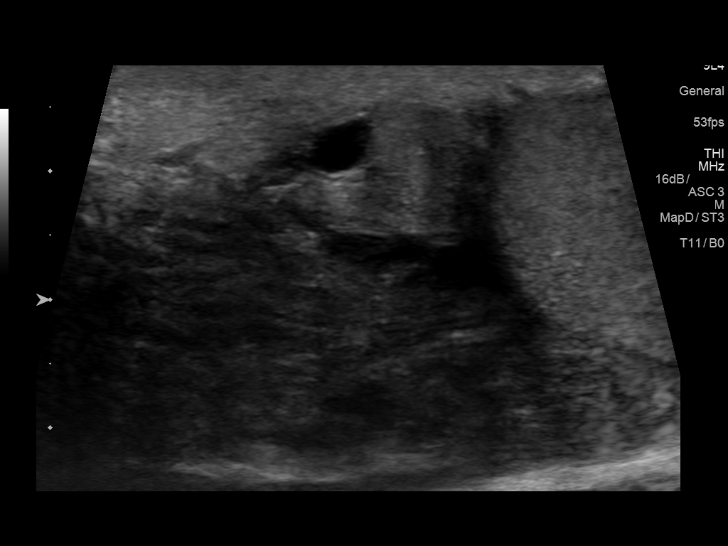
[im 54/65]
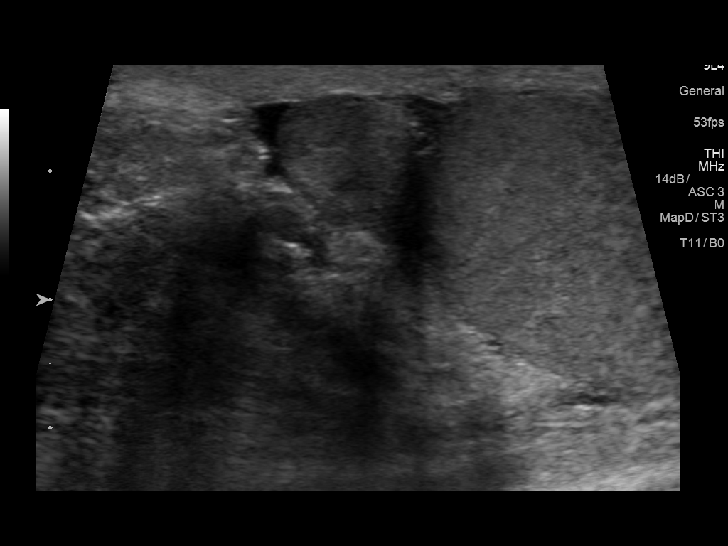
[im 59/65]
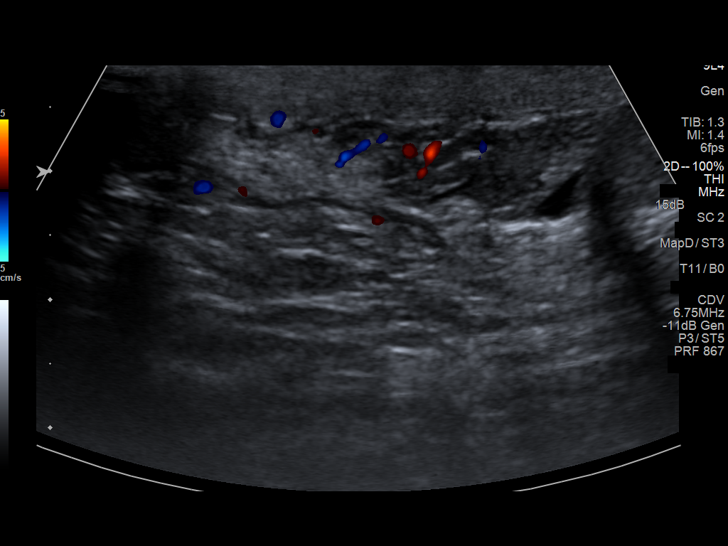
[im 65/65]
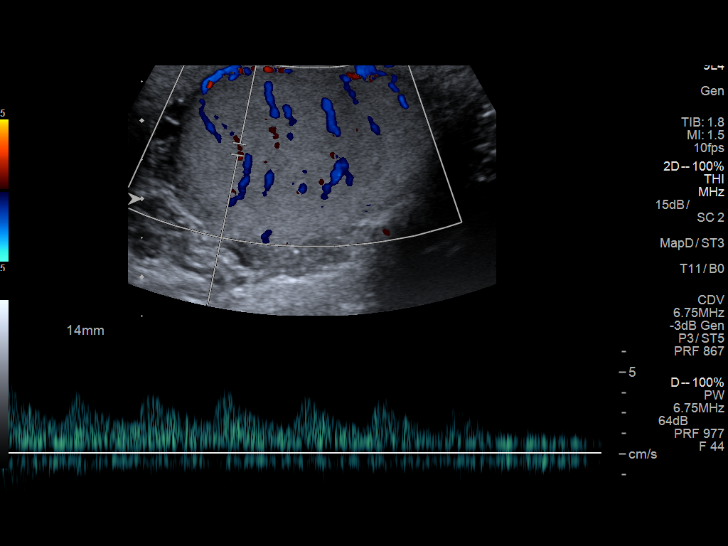

[13 of 25 positions shown; findings below may reference images not displayed]

FINDINGS: Right testicle

Measurements: 4.5 x 2.6 x 2.7 cm. No mass or microlithiasis
visualized.

Left testicle

Measurements: 3.8 x 2.2 x 2.9 cm. No mass or microlithiasis
visualized.

Right epididymis: The echotexture and contour of the right
epididymis is normal. There is an epididymal cyst measuring 4 mm in
diameter. Vascularity of the epididymal structures appears normal.

Left epididymis: There is a 1.1 x 1.0 x 1.2 cm diameter solid
appearing isoechoic mass associated with the epididymal tail it is
not hypervascular.

Hydrocele:  None visualized.

Varicocele:  None visualized.

Pulsed Doppler interrogation of both testes demonstrates normal low
resistance arterial and venous waveforms bilaterally.
IMPRESSION: Solid-appearing mass in the left epididymal tail without increased
vascularity. This could reflect a sperm granuloma or adenomatoid
tumor among other benign etiologies. Malignancy and acute
epididymitis are felt less likely. Urologic evaluation is
recommended.

Normal appearance of both testes as well as the right epididymis.

## 2019-10-16 MED FILL — CHLORHEXIDINE 0.12% RINSE: 0.12 | 16 days supply | Qty: 473 | Fill #0

## 2020-04-23 MED FILL — CHLORHEXIDINE 0.12% RINSE: 0.12 | 16 days supply | Qty: 473 | Fill #1

## 2020-04-30 MED FILL — HYDROCODON-APAP 5-325: 5-325 | 2 days supply | Qty: 14 | Fill #0

## 2020-04-30 MED FILL — PENICILLIN VK 500 MG TABLET: 500 | 7 days supply | Qty: 28 | Fill #0

## 2020-10-02 ENCOUNTER — Other Ambulatory Visit (HOSPITAL_COMMUNITY): Payer: Self-pay | Admitting: Dentistry

## 2020-10-02 MED FILL — CHLORHEXIDINE 0.12% RINSE: 0.12 | 5 days supply | Qty: 473 | Fill #0

## 2022-02-25 ENCOUNTER — Other Ambulatory Visit (HOSPITAL_COMMUNITY): Payer: Self-pay

## 2022-02-26 ENCOUNTER — Other Ambulatory Visit (HOSPITAL_COMMUNITY): Payer: Self-pay

## 2022-02-26 MED ORDER — CHLORHEXIDINE GLUCONATE 0.12 % MT SOLN
OROMUCOSAL | 99 refills | Status: AC
  Filled 2022-02-26: qty 473, 16d supply, fill #0
  Filled 2022-07-12: qty 473, 16d supply, fill #1

## 2022-03-03 ENCOUNTER — Other Ambulatory Visit (HOSPITAL_COMMUNITY): Payer: Self-pay

## 2022-07-13 ENCOUNTER — Other Ambulatory Visit (HOSPITAL_COMMUNITY): Payer: Self-pay

## 2022-10-06 ENCOUNTER — Encounter: Payer: Self-pay | Admitting: Gastroenterology

## 2023-06-30 ENCOUNTER — Other Ambulatory Visit (HOSPITAL_COMMUNITY): Payer: Self-pay

## 2023-07-01 ENCOUNTER — Other Ambulatory Visit (HOSPITAL_COMMUNITY): Payer: Self-pay

## 2023-07-02 ENCOUNTER — Other Ambulatory Visit (HOSPITAL_COMMUNITY): Payer: Self-pay

## 2023-07-02 MED ORDER — CHLORHEXIDINE GLUCONATE 0.12 % MT SOLN
OROMUCOSAL | 99 refills | Status: AC
  Filled 2023-07-02: qty 473, 16d supply, fill #0
  Filled 2024-01-26: qty 473, 16d supply, fill #1

## 2023-07-07 ENCOUNTER — Other Ambulatory Visit (HOSPITAL_COMMUNITY): Payer: Self-pay

## 2024-01-31 ENCOUNTER — Other Ambulatory Visit (HOSPITAL_COMMUNITY): Payer: Self-pay

## 2024-06-06 ENCOUNTER — Encounter: Payer: Self-pay | Admitting: Gastroenterology

## 2024-10-03 ENCOUNTER — Other Ambulatory Visit (HOSPITAL_COMMUNITY): Payer: Self-pay

## 2024-10-07 ENCOUNTER — Other Ambulatory Visit (HOSPITAL_COMMUNITY): Payer: Self-pay

## 2024-10-07 MED ORDER — CHLORHEXIDINE GLUCONATE 0.12 % MT SOLN
15.0000 mL | Freq: Two times a day (BID) | OROMUCOSAL | 99 refills | Status: AC
  Filled 2024-10-07: qty 473, 16d supply, fill #0

## 2024-10-23 ENCOUNTER — Other Ambulatory Visit (HOSPITAL_COMMUNITY): Payer: Self-pay
